# Patient Record
Sex: Male | Born: 1950 | Race: Black or African American | Hispanic: No
Health system: Southern US, Community
[De-identification: ages and names within clinical notes are randomized; demographics above are authoritative.]

---

## 2005-04-24 ENCOUNTER — Ambulatory Visit: Payer: Self-pay | Admitting: Family Medicine

## 2005-05-08 ENCOUNTER — Ambulatory Visit: Payer: Self-pay | Admitting: Family Medicine

## 2006-10-17 ENCOUNTER — Ambulatory Visit: Payer: Self-pay | Admitting: Family Medicine

## 2009-03-30 ENCOUNTER — Ambulatory Visit: Payer: Self-pay | Admitting: Family Medicine

## 2014-02-23 ENCOUNTER — Emergency Department: Payer: Self-pay | Admitting: Emergency Medicine

## 2014-02-23 LAB — COMPREHENSIVE METABOLIC PANEL
ALK PHOS: 76 U/L
ANION GAP: 14 (ref 7–16)
Albumin: 3.5 g/dL (ref 3.4–5.0)
BILIRUBIN TOTAL: 0.8 mg/dL (ref 0.2–1.0)
BUN: 9 mg/dL (ref 7–18)
CALCIUM: 10 mg/dL (ref 8.5–10.1)
CO2: 19 mmol/L — AB (ref 21–32)
Chloride: 96 mmol/L — ABNORMAL LOW (ref 98–107)
Creatinine: 0.73 mg/dL (ref 0.60–1.30)
EGFR (African American): >60
GLUCOSE: 238 mg/dL — AB (ref 65–99)
OSMOLALITY: 265 (ref 275–301)
POTASSIUM: 4.2 mmol/L (ref 3.5–5.1)
SGOT(AST): 13 U/L — ABNORMAL LOW (ref 15–37)
SGPT (ALT): 17 U/L (ref 12–78)
Sodium: 129 mmol/L — ABNORMAL LOW (ref 136–145)
Total Protein: 8.4 g/dL — ABNORMAL HIGH (ref 6.4–8.2)

## 2014-02-23 LAB — TROPONIN I: Troponin-I: <0.02 ng/mL

## 2014-02-23 LAB — CBC
HCT: 45.6 % (ref 40.0–52.0)
HGB: 15.5 g/dL (ref 13.0–18.0)
MCH: 30.1 pg (ref 26.0–34.0)
MCHC: 34 g/dL (ref 32.0–36.0)
MCV: 89 fL (ref 80–100)
PLATELETS: 428 10*3/uL (ref 150–440)
RBC: 5.14 10*6/uL (ref 4.40–5.90)
RDW: 12.4 % (ref 11.5–14.5)
WBC: 9.1 10*3/uL (ref 3.8–10.6)

## 2014-02-23 LAB — URINALYSIS, COMPLETE
Bacteria: NONE SEEN
Bilirubin,UR: NEGATIVE
Blood: NEGATIVE
Glucose,UR: >=500 mg/dL (ref 0–75)
Leukocyte Esterase: NEGATIVE
Nitrite: NEGATIVE
PH: 5 (ref 4.5–8.0)
Protein: NEGATIVE
RBC,UR: 1 /HPF (ref 0–5)
SPECIFIC GRAVITY: 1.017 (ref 1.003–1.030)
Squamous Epithelial: 1
WBC UR: 1 /HPF (ref 0–5)

## 2014-02-23 LAB — LIPASE, BLOOD: Lipase: 97 U/L (ref 73–393)

## 2016-07-07 DIAGNOSIS — Z5181 Encounter for therapeutic drug level monitoring: Secondary | ICD-10-CM | POA: Insufficient documentation

## 2016-07-07 DIAGNOSIS — R291 Meningismus: Secondary | ICD-10-CM | POA: Insufficient documentation

## 2016-07-08 ENCOUNTER — Emergency Department: Payer: Self-pay

## 2016-07-08 ENCOUNTER — Emergency Department
Admission: EM | Admit: 2016-07-08 | Discharge: 2016-07-08 | Payer: Self-pay | Attending: Emergency Medicine | Admitting: Emergency Medicine

## 2016-07-08 DIAGNOSIS — R519 Headache, unspecified: Secondary | ICD-10-CM

## 2016-07-08 DIAGNOSIS — R291 Meningismus: Secondary | ICD-10-CM

## 2016-07-08 DIAGNOSIS — M542 Cervicalgia: Secondary | ICD-10-CM

## 2016-07-08 DIAGNOSIS — R51 Headache: Secondary | ICD-10-CM

## 2016-07-08 LAB — COMPREHENSIVE METABOLIC PANEL
ALBUMIN: 4.3 g/dL (ref 3.5–5.0)
ALT: 39 U/L (ref 17–63)
AST: 25 U/L (ref 15–41)
Alkaline Phosphatase: 65 U/L (ref 38–126)
Anion gap: 11 (ref 5–15)
BUN: 26 mg/dL — AB (ref 6–20)
CHLORIDE: 96 mmol/L — AB (ref 101–111)
CO2: 28 mmol/L (ref 22–32)
CREATININE: 1.26 mg/dL — AB (ref 0.61–1.24)
Calcium: 9.6 mg/dL (ref 8.9–10.3)
GFR calc Af Amer: >60 mL/min (ref >60)
GFR, EST NON AFRICAN AMERICAN: 59 mL/min — AB (ref >60)
GLUCOSE: 174 mg/dL — AB (ref 65–99)
Potassium: 3.5 mmol/L (ref 3.5–5.1)
SODIUM: 135 mmol/L (ref 135–145)
Total Bilirubin: 0.9 mg/dL (ref 0.3–1.2)
Total Protein: 9.4 g/dL — ABNORMAL HIGH (ref 6.5–8.1)

## 2016-07-08 LAB — CBC WITH DIFFERENTIAL/PLATELET
Basophils Absolute: 0.1 10*3/uL (ref 0–0.1)
Basophils Relative: 1 %
Eosinophils Absolute: 0.1 10*3/uL (ref 0–0.7)
Eosinophils Relative: 1 %
HEMATOCRIT: 47 % (ref 40.0–52.0)
HEMOGLOBIN: 15.3 g/dL (ref 13.0–18.0)
LYMPHS PCT: 20 %
Lymphs Abs: 2 10*3/uL (ref 1.0–3.6)
MCH: 29.2 pg (ref 26.0–34.0)
MCHC: 32.6 g/dL (ref 32.0–36.0)
MCV: 89.6 fL (ref 80.0–100.0)
MONO ABS: 1.1 10*3/uL — AB (ref 0.2–1.0)
MONOS PCT: 11 %
NEUTROS ABS: 6.7 10*3/uL — AB (ref 1.4–6.5)
NEUTROS PCT: 67 %
Platelets: 341 10*3/uL (ref 150–440)
RBC: 5.25 MIL/uL (ref 4.40–5.90)
RDW: 13.4 % (ref 11.5–14.5)
WBC: 10.1 10*3/uL (ref 3.8–10.6)

## 2016-07-08 LAB — LIPASE, BLOOD: Lipase: 22 U/L (ref 11–51)

## 2016-07-08 LAB — APTT: aPTT: 34 seconds (ref 24–36)

## 2016-07-08 LAB — TROPONIN I: Troponin I: <0.03 ng/mL (ref <0.03)

## 2016-07-08 LAB — PROTIME-INR
INR: 1.17
Prothrombin Time: 15 seconds (ref 11.4–15.2)

## 2016-07-08 LAB — LACTIC ACID, PLASMA: LACTIC ACID, VENOUS: 1 mmol/L (ref 0.5–1.9)

## 2016-07-08 MED ORDER — MORPHINE SULFATE (PF) 2 MG/ML IV SOLN
INTRAVENOUS | Status: DC
Start: 2016-07-08 — End: 2016-07-08
  Filled 2016-07-08: qty 2

## 2016-07-08 MED ORDER — ONDANSETRON HCL 4 MG/2ML IJ SOLN
4.0000 mg | INTRAMUSCULAR | Status: AC
  Administered 2016-07-08: 4 mg via INTRAVENOUS

## 2016-07-08 MED ORDER — DEXAMETHASONE SODIUM PHOSPHATE 10 MG/ML IJ SOLN
10.0000 mg | Freq: Four times a day (QID) | INTRAMUSCULAR | Status: DC
  Administered 2016-07-08 (×2): 10 mg via INTRAVENOUS
  Filled 2016-07-08 (×2): qty 1

## 2016-07-08 MED ORDER — MORPHINE SULFATE (PF) 2 MG/ML IV SOLN
INTRAVENOUS | Status: AC
  Filled 2016-07-08: qty 2

## 2016-07-08 MED ORDER — VANCOMYCIN HCL IN DEXTROSE 1-5 GM/200ML-% IV SOLN
1000.0000 mg | Freq: Once | INTRAVENOUS | Status: AC
  Administered 2016-07-08: 1000 mg via INTRAVENOUS
  Filled 2016-07-08: qty 200

## 2016-07-08 MED ORDER — SODIUM CHLORIDE 0.9 % IV BOLUS (SEPSIS)
1000.0000 mL | Freq: Once | INTRAVENOUS | Status: AC
  Administered 2016-07-08: 1000 mL via INTRAVENOUS

## 2016-07-08 MED ORDER — DEXTROSE 5 % IV SOLN
2.0000 g | Freq: Two times a day (BID) | INTRAVENOUS | Status: DC
  Administered 2016-07-08: 2 g via INTRAVENOUS
  Filled 2016-07-08 (×2): qty 2

## 2016-07-08 MED ORDER — SODIUM CHLORIDE 0.9 % IV SOLN
2.0000 g | INTRAVENOUS | Status: DC
  Administered 2016-07-08: 2 g via INTRAVENOUS
  Filled 2016-07-08 (×7): qty 2000

## 2016-07-08 MED ORDER — ONDANSETRON HCL 4 MG/2ML IJ SOLN
INTRAMUSCULAR | Status: AC
  Filled 2016-07-08: qty 2

## 2016-07-08 MED ORDER — MORPHINE SULFATE (PF) 4 MG/ML IV SOLN
4.0000 mg | Freq: Once | INTRAVENOUS | Status: AC
  Administered 2016-07-08: 4 mg via INTRAVENOUS

## 2016-07-08 NOTE — ED Notes (Signed)
[phone] notified patient en route.

## 2016-07-08 NOTE — ED Provider Notes (Signed)
[facility] Emergency Department Provider Note  ____________________________________________   First MD Initiated Contact with Patient 07/08/16 0012     (approximate)  I have reviewed the triage vital signs and the nursing notes.   HISTORY  Chief Complaint Neck Pain    HPI Luis Sullivan is a 65 y.o. male presents for evaluation of general malaise, severe headache, and severe neck pain as well as subjective fever and chills.  He was started on amoxicillin for strep throat about 3 days ago but states that although his sore throat is getting better, his headache and neck pain are getting much worse.  He has had some chills today when other people around him are hot.  He has had some nausea but no vomiting.  He denies chest pain or shortness of breath.  He has not had any altered mental status of which she is aware.  He describes the headache as being in the back of his head, severe, worse with any movement, especially with extension of his neck.  His position of comfort is in slight flexion and states that any movement side to side or flexion and extension causes a pulling and painful sensation in the back of his head and down his neck.  He has no numbness or tingling in any of his extremities and has had no difficulty with ambulation and no weakness in his extremities.  He has never had anything like this happen in the past.   No past medical history on file.  There are no active problems to display for this patient.   No past surgical history on file.  Prior to Admission medications   Not on File    Allergies Review of patient's allergies indicates no known allergies.  No family history on file.  Social History Social History  Substance Use Topics  . Smoking status: Not on file  . Smokeless tobacco: Not on file  . Alcohol use Not on file    Review of Systems Constitutional: Subjective fever/chills Eyes: No visual changes. ENT: Improving  sore throat over the last few days.  Severe pain in the back of his neck when he tries to move his head side-to-side or up and down. Cardiovascular: Denies chest pain. Respiratory: Denies shortness of breath. Gastrointestinal: No abdominal pain.  Nausea, no vomiting.  No diarrhea.  No constipation. Genitourinary: Negative for dysuria. Musculoskeletal: Negative for back pain. Skin: Negative for rash. Neurological: Negative for headaches, focal weakness or numbness.  10-point ROS otherwise negative.  ____________________________________________   PHYSICAL EXAM:  VITAL SIGNS: ED Triage Vitals  Enc Vitals Group     BP 07/08/16 0005 (!) 151/91     Pulse Rate 07/08/16 0005 (!) 110     Resp 07/08/16 0005 (!) 22     Temp 07/08/16 0005 98.7 F (37.1 C)     Temp src --      SpO2 07/08/16 0005 97 %     Weight 07/08/16 0005 260 lb (117.9 kg)     Height 07/08/16 0005 6\' 2"  (1.88 m)     Head Circumference --      Peak Flow --      Pain Score 07/08/16 0006 10     Pain Loc --      Pain Edu? --      Excl. in GC? --     Constitutional: Alert and oriented.  Appears uncomfortable but nontoxic Eyes: Conjunctivae are normal. PERRL. EOMI. Head: Atraumatic. Nose: No congestion/rhinnorhea. Mouth/Throat: Mucous  membranes are moist.  Oropharynx non-erythematous. Neck: No stridor.  The patient is holding his neck in slight flexion and has severe tenderness with attempts to extend or even additionally flex his head.  Pain is also reproduced with turning his head side to side.  No step-offs or deformities upon palpation of the cervical spine. Cardiovascular: Tachycardia, regular rhythm. Good peripheral circulation. Grossly normal heart sounds. Respiratory: Normal respiratory effort does have tachypnea at rest.  No retractions. Lungs CTAB. Gastrointestinal: Soft and nontender. No distention.  Musculoskeletal: No lower extremity tenderness nor edema. No gross deformities of extremities. Neurologic:   Normal speech and language. No gross focal neurologic deficits are appreciated. GCS 15. Skin:  Skin is warm, dry and intact. No rash noted. Psychiatric: Mood and affect are normal. Speech and behavior are normal.  ____________________________________________   LABS (all labs ordered are listed, but only abnormal results are displayed)  Labs Reviewed  COMPREHENSIVE METABOLIC PANEL - Abnormal; Notable for the following:       Result Value   Chloride 96 (*)    Glucose, Bld 174 (*)    BUN 26 (*)    Creatinine, Ser 1.26 (*)    Total Protein 9.4 (*)    GFR calc non Af Amer 59 (*)    All other components within normal limits  CBC WITH DIFFERENTIAL/PLATELET - Abnormal; Notable for the following:    Neutro Abs 6.7 (*)    Monocytes Absolute 1.1 (*)    All other components within normal limits  CULTURE, BLOOD (ROUTINE X 2)  CULTURE, BLOOD (ROUTINE X 2)  URINE CULTURE  LACTIC ACID, PLASMA  LIPASE, BLOOD  TROPONIN I  APTT  PROTIME-INR  URINALYSIS COMPLETEWITH MICROSCOPIC ([facility] ONLY)  HIV ANTIBODY (ROUTINE TESTING)   ____________________________________________  EKG  ED ECG REPORT I, Maxey Ransom, the attending physician, personally viewed and interpreted this ECG.  Date: 07/08/2016 EKG Time: 01:59 Rate: 99 Rhythm: borderline sinus tachycardia QRS Axis: normal Intervals: normal ST/T Wave abnormalities: normal Conduction Disturbances: none Narrative Interpretation: unremarkable  ____________________________________________  RADIOLOGY   Ct Head Wo Contrast  Result Date: 07/08/2016 CLINICAL DATA:  Pt states that he started amoxicillin on Friday for strep throat, states that since his neck has increased in pain as well as severe headache, pt has difficulty bending neck forward due to severity of pain. Provider concerned for meningitis. EXAM: CT HEAD WITHOUT CONTRAST TECHNIQUE: Contiguous axial images were obtained from the base of the skull through the vertex without  intravenous contrast. COMPARISON:  None. FINDINGS: Brain: No evidence of acute infarction, hemorrhage, hydrocephalus, extra-axial collection or mass lesion/mass effect. Vascular: No hyperdense vessel or unexpected calcification. Skull: Normal. Negative for fracture or focal lesion. Sinuses/Orbits: Retention cyst in the 8 left maxillary antrum and right sphenoid sinus. Mucosal thickening in the ethmoid air cells. Nasal polyp versus retention cyst in the right nasal passage. Mastoid air cells are not opacified. Other: No meningeal thickening to suggest evidence of meningitis. MRI would be more sensitive for diagnosis of meningitis. IMPRESSION: No acute intracranial abnormalities. Electronically Signed   By: Burman Nieves M.D.   On: 07/08/2016 02:05   Dg Chest Port 1 View  Result Date: 07/08/2016 CLINICAL DATA:  Started amoxicillin on Friday for strep throat. Increasing neck pain and headache. EXAM: PORTABLE CHEST 1 VIEW COMPARISON:  03/30/2009 FINDINGS: Normal heart size and pulmonary vascularity. Shallow inspiration with mild linear atelectasis in the left lung base. No focal airspace disease or consolidation. No blunting of costophrenic angles. No pneumothorax. Calcified  and tortuous aorta IMPRESSION: Shallow inspiration with linear atelectasis in the left base. Electronically Signed   By: Burman NievesWilliam  Stevens M.D.   On: 07/08/2016 01:17    ____________________________________________   PROCEDURES  Procedure(s) performed:   .Critical Care Performed by: Loleta RoseFORBACH, Pallavi Clifton Authorized by: Loleta RoseFORBACH, Amilyah Nack   Critical care provider statement:    Critical care time (minutes):  60   Critical care time was exclusive of:  Separately billable procedures and treating other patients   Critical care was time spent personally by me on the following activities:  Development of treatment plan with patient or surrogate, discussions with consultants, evaluation of patient's response to treatment, examination of patient,  obtaining history from patient or surrogate, ordering and performing treatments and interventions, ordering and review of laboratory studies, ordering and review of radiographic studies, pulse oximetry, re-evaluation of patient's condition and review of old charts .Lumbar Puncture Date/Time: 07/08/2016 12:57 AM Performed by: Loleta RoseFORBACH, Elta Angell Authorized by: Loleta RoseFORBACH, Eugene Zeiders   Consent:    Consent obtained:  Verbal and written   Consent given by:  Patient   Risks discussed:  Headache, bleeding, infection, pain and nerve damage Pre-procedure details:    Procedure purpose:  Diagnostic   Preparation: Patient was prepped and draped in usual sterile fashion   Anesthesia (see MAR for exact dosages):    Anesthesia method:  Local infiltration   Local anesthetic:  Lidocaine 1% w/o epi Procedure details:    Lumbar space:  L4-L5 interspace   Patient position:  L lateral decubitus   Needle gauge:  22   Needle type:  Spinal needle - Quincke tip   Number of attempts:  4 Post-procedure:    Puncture site:  Adhesive bandage applied and direct pressure applied   Patient tolerance of procedure:  Tolerated well, no immediate complications (Failure - unable to obtain sample after 4 attempts)     Critical Care performed: Yes, see critical care procedure note(s) ____________________________________________   INITIAL IMPRESSION / ASSESSMENT AND PLAN / ED COURSE  Pertinent labs & imaging results that were available during my care of the patient were reviewed by me and considered in my medical decision making (see chart for details).  I am very concerned about the possibility of meningitis given the patient's history and current physical exam.  I ordered code sepsis on him based on his tachycardia and tachypnea and the probability that he does have an infection.  Given his age and history I will obtain a CT scan of his head prior to performing the lumbar puncture.  I am giving Decadron 10 mg now so that we can  start empiric antibiotics within about 30 minutes and hopefully I can get the lumbar puncture done once the CT scan results are back.  He is afebrile at this time but has had chills and possibly rigors earlier today.  Given his age I have ordered the recommended empiric therapy of ceftriaxone, vancomycin, and ampicillin.  I am ordering 1 L of fluids at this time but will increase to 30 mL/kg as needed for sepsis.  I am also giving morphine 4 mg and Zofran 4 mg and will likely re-dose of morphine prior to attempting a lumbar puncture.  I had my usual and customary risks and benefits discussion with the patient and consented him for the lumbar puncture.   Clinical Course  Value Comment By Time  CT HEAD WO CONTRAST Unremarkable - will proceed with LP. Loleta Roseory Darsi Tien, MD 10/02 0215   I failed to obtain a  successful lumbar puncture after 4 attempts.  His body habitus is not conducive to the procedure in spite of good positioning.  I tried in both left lateral and upright position.  I will treat him empirically although his blood work is reassuring.  He continues to have some meningismus although it is improved after morphine.  He told me he has full benefits at the [place] so I will not contact the [place] for a transfer. Loleta Rose, MD 10/02 [phone]   Spoke by phone with attending at [facility] ED.  Accepted transfer.   Loleta Rose, MD 10/02 [phone]    ____________________________________________  FINAL CLINICAL IMPRESSION(S) / ED DIAGNOSES  Final diagnoses:  Meningismus  Neck pain  Intractable headache, unspecified chronicity pattern, unspecified headache type     MEDICATIONS GIVEN DURING THIS VISIT:  Medications  dexamethasone (DECADRON) injection 10 mg (10 mg Intravenous Given 07/08/16 0050)  cefTRIAXone (ROCEPHIN) 2 g in dextrose 5 % 50 mL IVPB (0 g Intravenous Stopped 07/08/16 0400)  ampicillin (OMNIPEN) 2 g in sodium chloride 0.9 % 50 mL IVPB (2 g Intravenous Not Given 07/08/16 0409)  morphine 2 MG/ML  injection (  Not Given 07/08/16 0531)  morphine 2 MG/ML injection (4 mg  Not Given 07/08/16 0529)  vancomycin (VANCOCIN) IVPB 1000 mg/200 mL premix (0 mg Intravenous Stopped 07/08/16 0354)  morphine 4 MG/ML injection 4 mg (4 mg Intravenous Given 07/08/16 0055)  ondansetron (ZOFRAN) injection 4 mg (4 mg Intravenous Given 07/08/16 0050)  sodium chloride 0.9 % bolus 1,000 mL (0 mLs Intravenous Stopped 07/08/16 0406)     NEW OUTPATIENT MEDICATIONS STARTED DURING THIS VISIT:  New Prescriptions   No medications on file    Modified Medications   No medications on file    Discontinued Medications   No medications on file     Note:  This document was prepared using Dragon voice recognition software and may include unintentional dictation errors.    Loleta Rose, MD 07/08/16 [phone]

## 2016-07-08 NOTE — Progress Notes (Signed)
  Pharmacy Antibiotic Note  Luis Sullivan is a 65 y.o. male admitted on 07/08/2016 with meningitis.  Pharmacy has been consulted for vancomycin dosing.  Plan: Vancomycin 1 gm IV x 1 in ED followed in 6 hours (stacked dosing) by vancomycin 1.25 gm IV Q8H, predicted trough 16 mcg/mL. Pharmacy will continue to follow and adjust as needed to maintain trough 15 to 20 mcg/mL (although higher trough may be acceptable if meningitis proven).   Vd 82.5 L, Ke 0.086 hr-1, T1/2 8 hr  Height: 6\' 2"  (188 cm) Weight: 260 lb (117.9 kg) IBW/kg (Calculated) : 82.2  Temp (24hrs), Avg:98.7 F (37.1 C), Min:98.7 F (37.1 C), Max:98.7 F (37.1 C)   Recent Labs Lab 07/08/16 0117  WBC 10.1  CREATININE 1.26*  LATICACIDVEN 1.0    Estimated Creatinine Clearance: 80.8 mL/min (by C-G formula based on SCr of 1.26 mg/dL (H)).    No Known Allergies  Thank you for allowing pharmacy to be a part of this patient's care.  Carola FrostNathan A Jaylene Arrowood, Pharm.D., BCPS Clinical Pharmacist 07/08/2016 2:56 AM

## 2016-07-08 NOTE — ED Triage Notes (Signed)
Pt states that he started amoxicillin on Friday for strep throat, states that since his neck has increased in pain as well as severe headache, pt has difficulty bending neck forward due to severity of pain, pt skin is clammy to touch

## 2016-07-08 NOTE — ED Notes (Signed)
Helped to position pt for Lumbar puncture with MD Forbach. [place] Cerise York CeriseForbach explained procedure/purpose of procedure to pt. Pt signed consent for procedure. Witness MD perform procedure.

## 2016-07-09 LAB — HIV ANTIBODY (ROUTINE TESTING W REFLEX): HIV Screen 4th Generation wRfx: NONREACTIVE

## 2016-07-13 LAB — CULTURE, BLOOD (ROUTINE X 2)
CULTURE: NO GROWTH
CULTURE: NO GROWTH

## 2017-02-06 IMAGING — DX DG CHEST 1V PORT
1 series · 1 of 1 positions shown · non-contrast
Comparison: 03/30/2009

CLINICAL DATA: Started amoxicillin on [REDACTED] for strep throat.
Increasing neck pain and headache.

EXAM:
PORTABLE CHEST 1 VIEW

[chest ap]
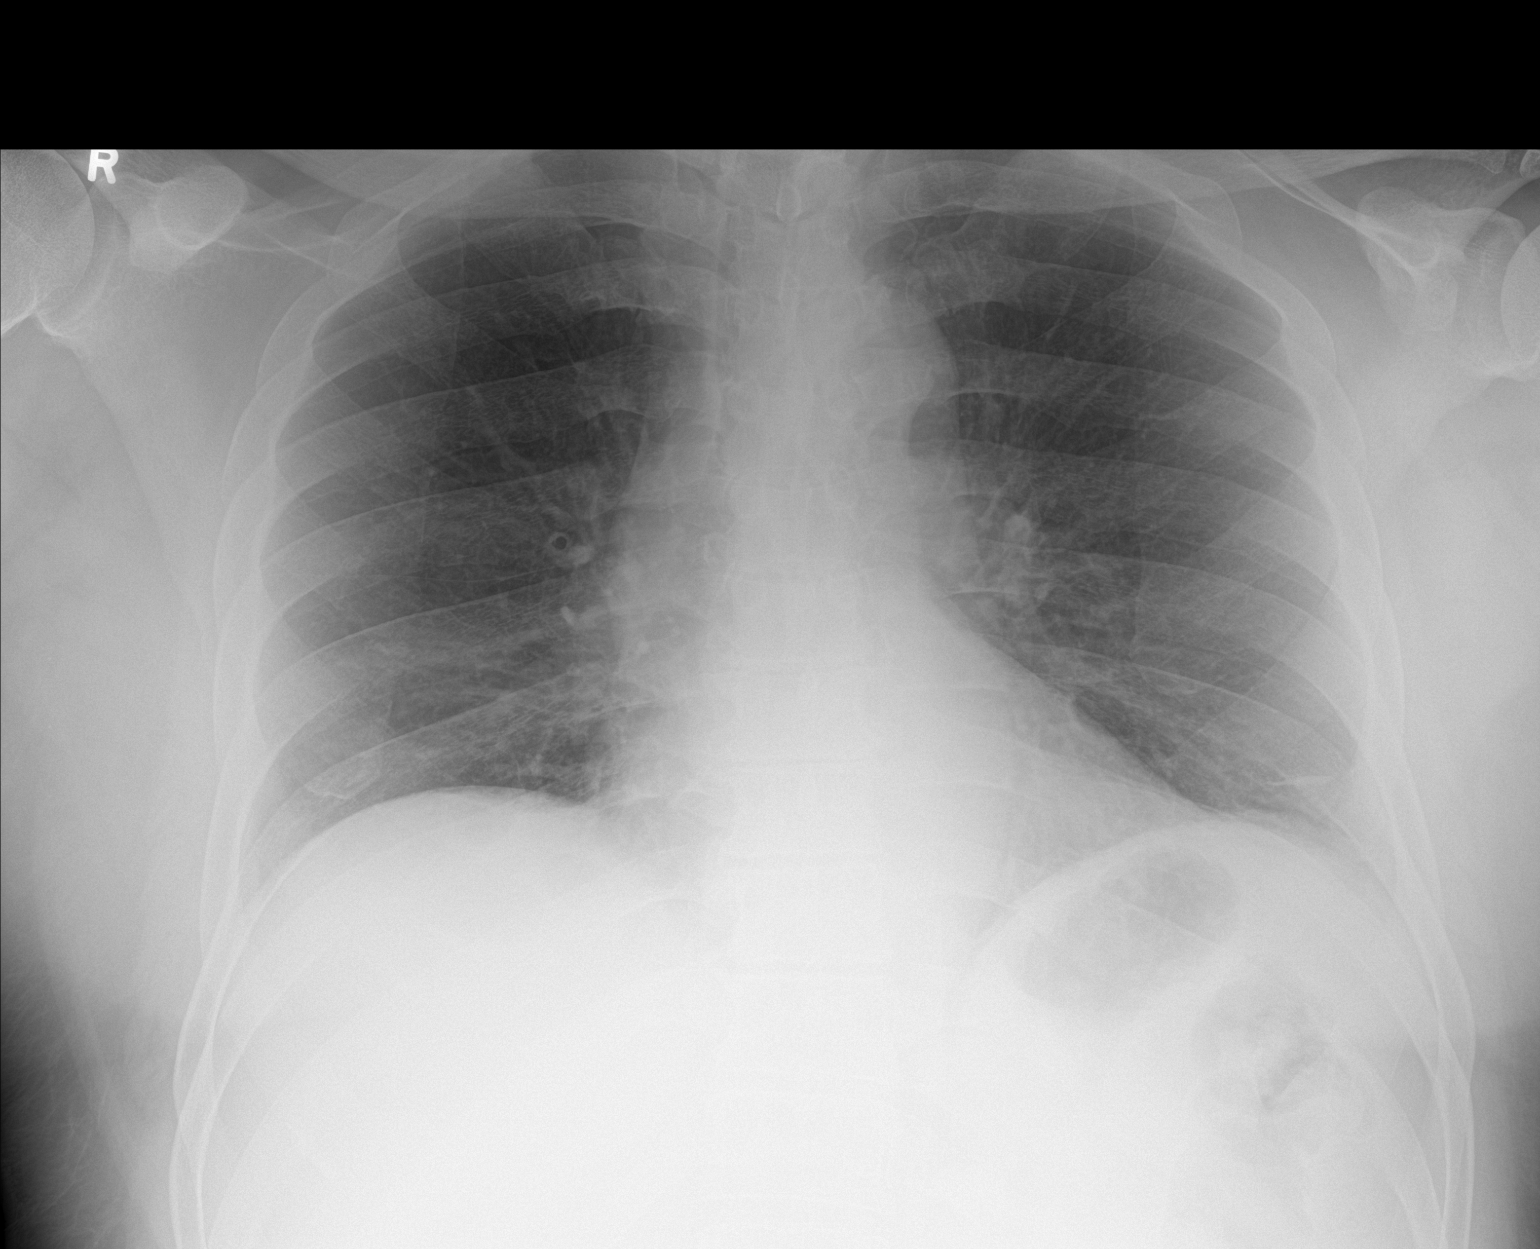

[1 of 1 positions shown; findings below may reference images not displayed]

FINDINGS: Normal heart size and pulmonary vascularity. Shallow inspiration
with mild linear atelectasis in the left lung base. No focal
airspace disease or consolidation. No blunting of costophrenic
angles. No pneumothorax. Calcified and tortuous aorta
IMPRESSION: Shallow inspiration with linear atelectasis in the left base.

## 2017-02-06 IMAGING — CT CT HEAD W/O CM
3 series · 15 of 47 positions shown, 18 images · non-contrast
Comparison: None.

CLINICAL DATA: Pt states that he started amoxicillin on [REDACTED] for
strep throat, states that since his neck has increased in pain as
well as severe headache, pt has difficulty bending neck forward due
to severity of pain. Provider concerned for meningitis.

EXAM:
CT HEAD WITHOUT CONTRAST
TECHNIQUE: Contiguous axial images were obtained from the base of the skull
through the vertex without intravenous contrast.

[Series 2: head wo · axial · 0.47mm/px · z∈[-167,-37]mm · 9 of 32 slices shown, 12 images]
[im 3/32  brain]
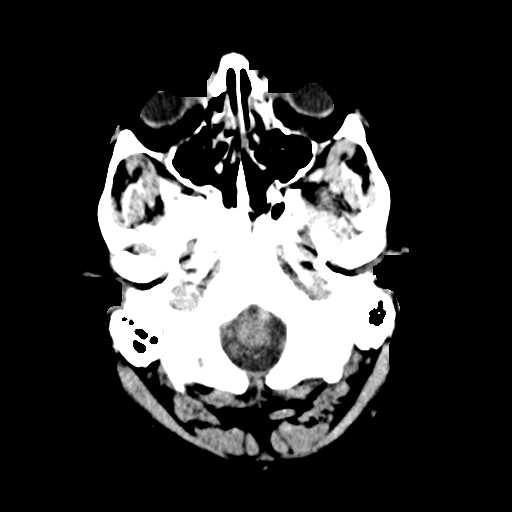
[im 3/32  bone]
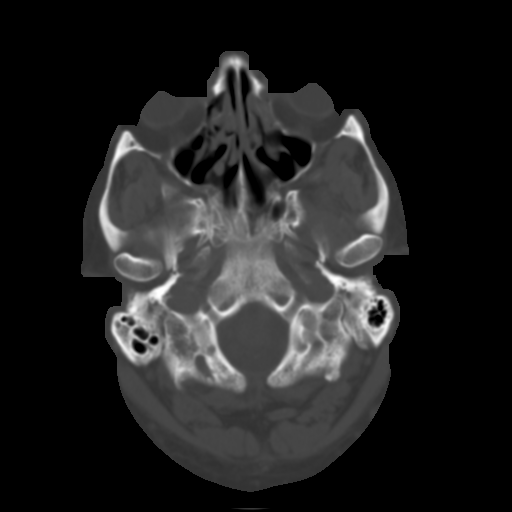
[im 6/32  brain]
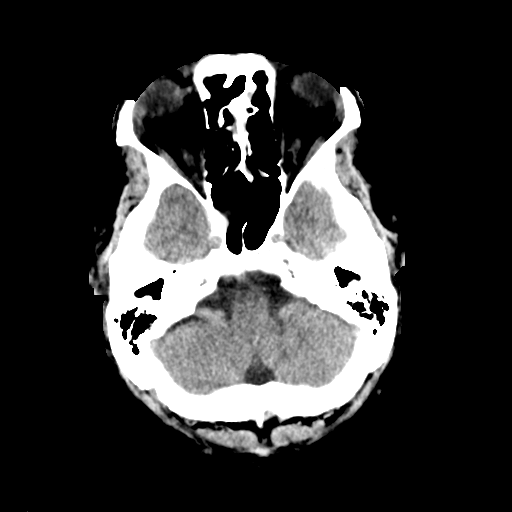
[im 9/32  brain]
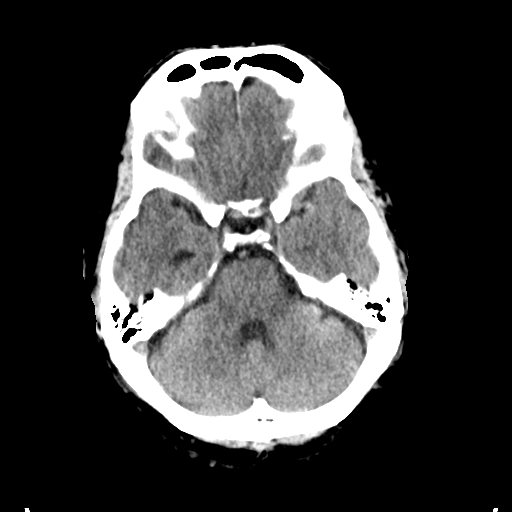
[im 12/32  brain]
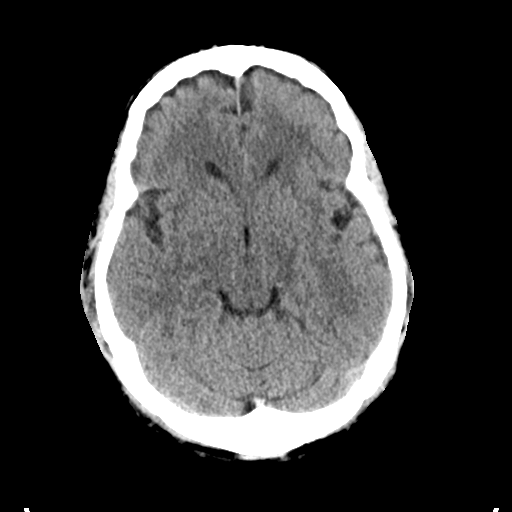
[im 17/32  brain]
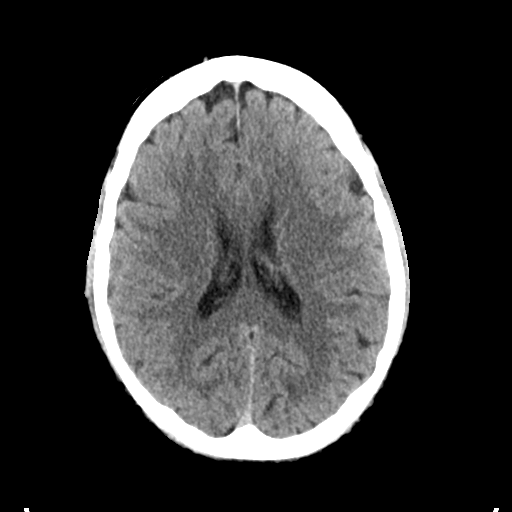
[im 17/32  bone]
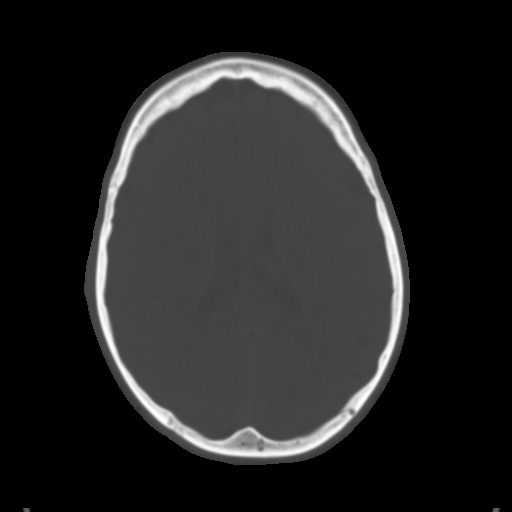
[im 20/32  brain]
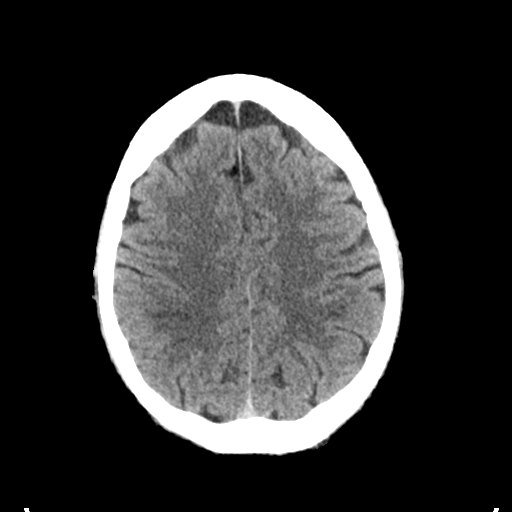
[im 23/32  brain]
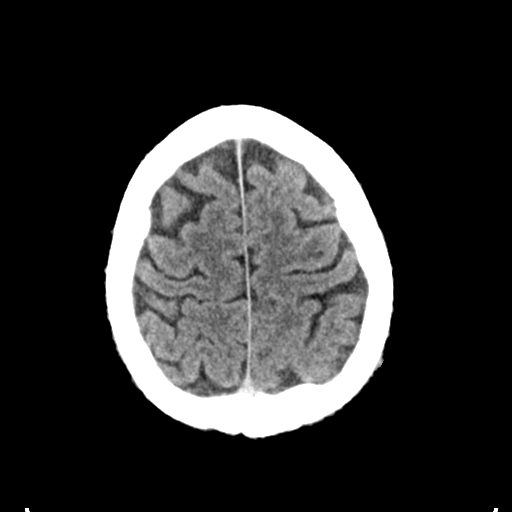
[im 26/32  brain]
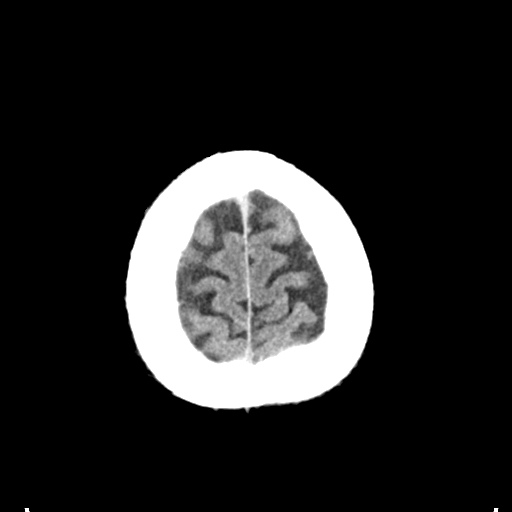
[im 29/32  brain]
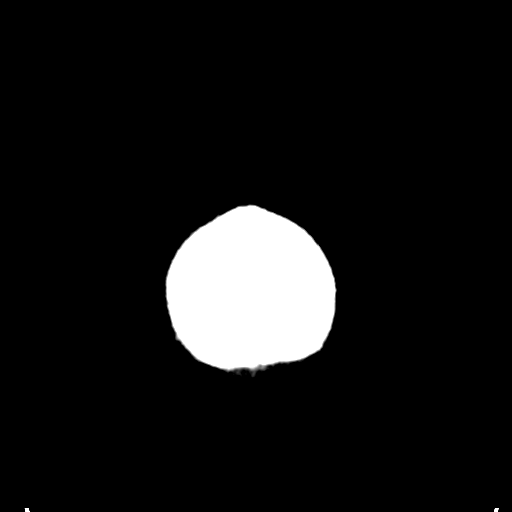
[im 29/32  bone]
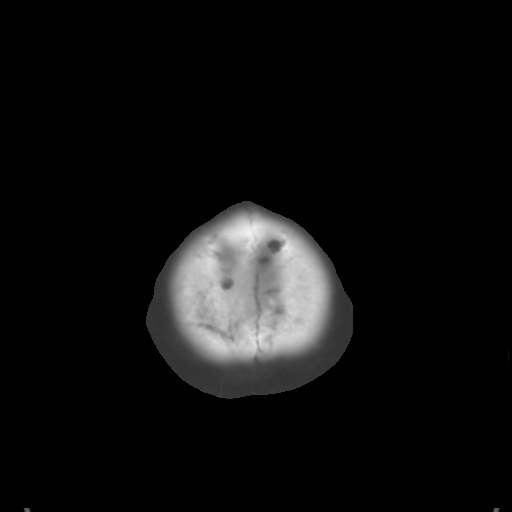

[Series 4: coronal soft tissue · coronal · 0.32mm/px · 3 of 67 slices shown]
[im 23/67  brain]
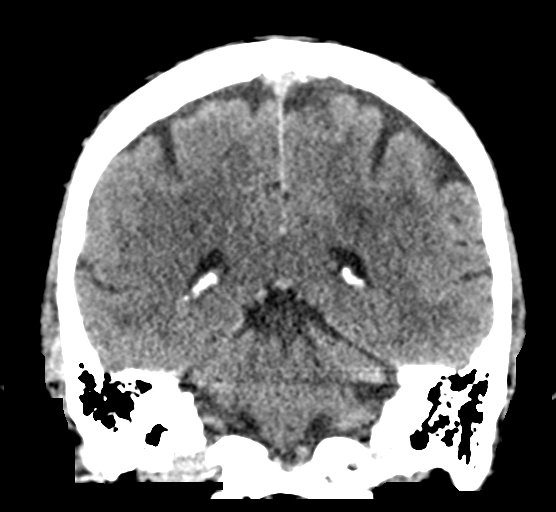
[im 30/67  brain]
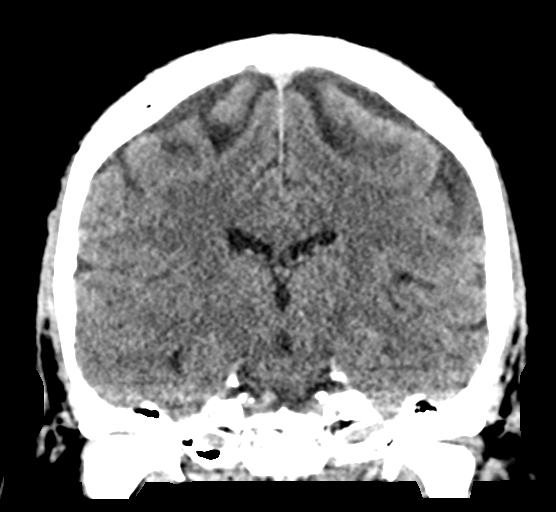
[im 37/67  brain]
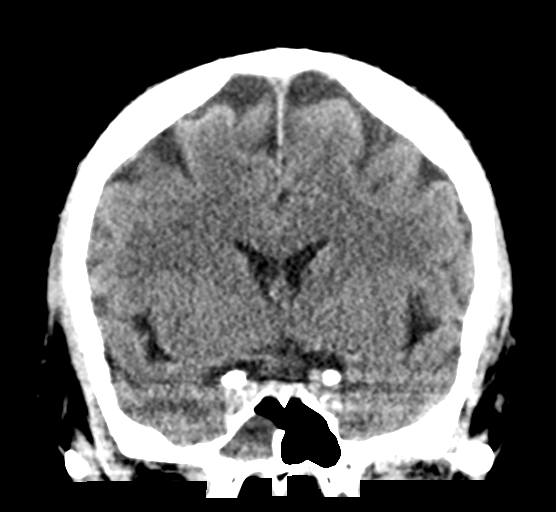

[Series 5: sagittal soft tissue · sagittal · 0.32mm/px · 3 of 57 slices shown]
[im 19/57  brain]
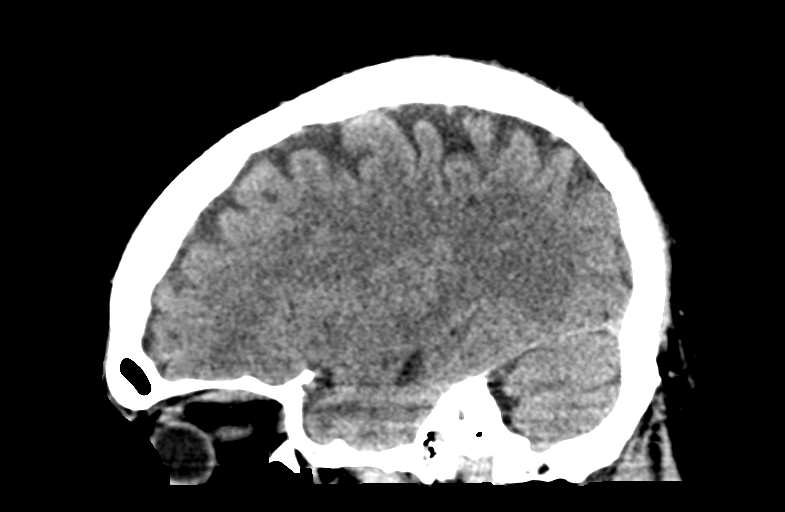
[im 29/57  brain]
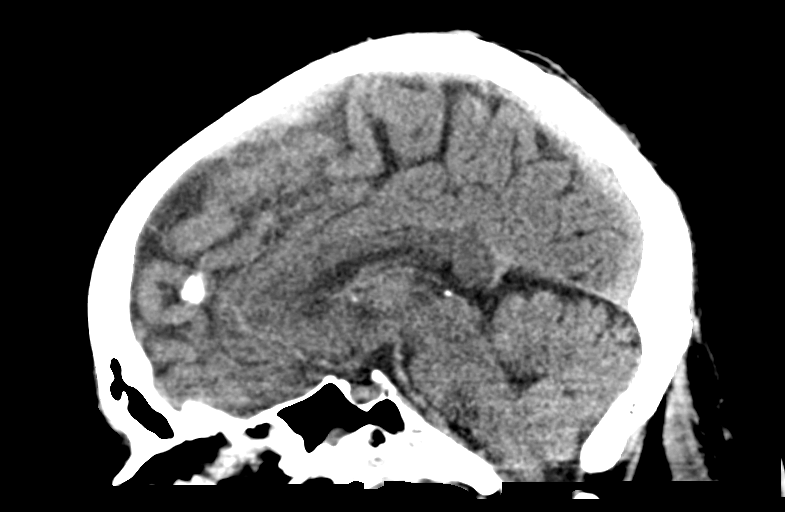
[im 38/57  brain]
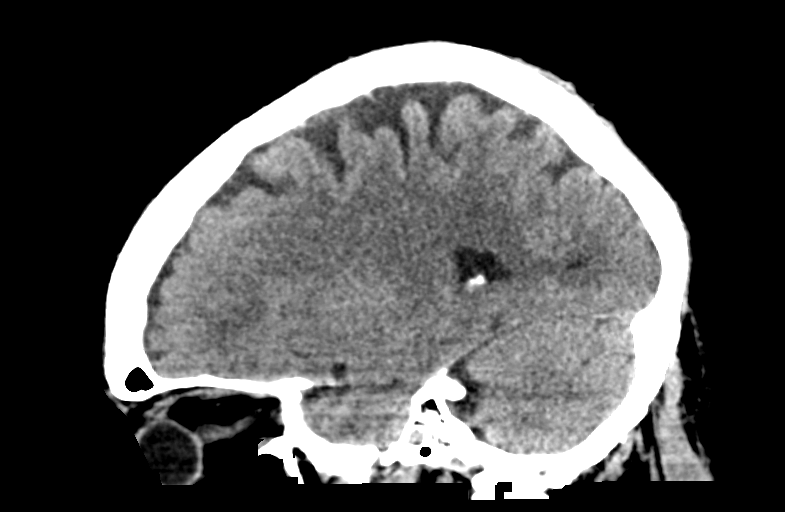

[15 of 47 positions shown; findings below may reference images not displayed]

FINDINGS: Brain: No evidence of acute infarction, hemorrhage, hydrocephalus,
extra-axial collection or mass lesion/mass effect.

Vascular: No hyperdense vessel or unexpected calcification.

Skull: Normal. Negative for fracture or focal lesion.

Sinuses/Orbits: Retention cyst in the 8 left maxillary antrum and
right sphenoid sinus. Mucosal thickening in the ethmoid air cells.
Nasal polyp versus retention cyst in the right nasal passage.
Mastoid air cells are not opacified.

Other: No meningeal thickening to suggest evidence of meningitis.
MRI would be more sensitive for diagnosis of meningitis.
IMPRESSION: No acute intracranial abnormalities.
